# Patient Record
Sex: Male | Born: 1955 | Race: Black or African American | Hispanic: No | Smoking: Current every day smoker
Health system: Southern US, Community
[De-identification: ages and names within clinical notes are randomized; demographics above are authoritative.]

---

## 2021-02-12 ENCOUNTER — Emergency Department: Payer: Medicare PPO

## 2021-02-12 ENCOUNTER — Other Ambulatory Visit: Payer: Self-pay

## 2021-02-12 ENCOUNTER — Emergency Department
Admission: EM | Admit: 2021-02-12 | Discharge: 2021-02-13 | Disposition: A | Discharge status: Home / Self Care | Payer: Medicare PPO | Attending: Emergency Medicine | Admitting: Emergency Medicine

## 2021-02-12 DIAGNOSIS — R2243 Localized swelling, mass and lump, lower limb, bilateral: Secondary | ICD-10-CM | POA: Insufficient documentation

## 2021-02-12 DIAGNOSIS — R030 Elevated blood-pressure reading, without diagnosis of hypertension: Secondary | ICD-10-CM | POA: Diagnosis present

## 2021-02-12 DIAGNOSIS — F17210 Nicotine dependence, cigarettes, uncomplicated: Secondary | ICD-10-CM | POA: Insufficient documentation

## 2021-02-12 DIAGNOSIS — I10 Essential (primary) hypertension: Secondary | ICD-10-CM | POA: Insufficient documentation

## 2021-02-12 DIAGNOSIS — R202 Paresthesia of skin: Secondary | ICD-10-CM | POA: Diagnosis not present

## 2021-02-12 LAB — BASIC METABOLIC PANEL
Anion gap: 8 (ref 5–15)
BUN: 15 mg/dL (ref 8–23)
CO2: 23 mmol/L (ref 22–32)
Calcium: 9.5 mg/dL (ref 8.9–10.3)
Chloride: 107 mmol/L (ref 98–111)
Creatinine, Ser: 0.89 mg/dL (ref 0.61–1.24)
GFR, Estimated: >60 mL/min (ref >60)
Glucose, Bld: 104 mg/dL — ABNORMAL HIGH (ref 70–99)
Potassium: 4 mmol/L (ref 3.5–5.1)
Sodium: 138 mmol/L (ref 135–145)

## 2021-02-12 LAB — CBC
HCT: 47.3 % (ref 39.0–52.0)
Hemoglobin: 16.4 g/dL (ref 13.0–17.0)
MCH: 31 pg (ref 26.0–34.0)
MCHC: 34.7 g/dL (ref 30.0–36.0)
MCV: 89.4 fL (ref 80.0–100.0)
Platelets: 219 10*3/uL (ref 150–400)
RBC: 5.29 MIL/uL (ref 4.22–5.81)
RDW: 13.8 % (ref 11.5–15.5)
WBC: 5.3 10*3/uL (ref 4.0–10.5)
nRBC: 0 % (ref 0.0–0.2)

## 2021-02-12 LAB — TROPONIN I (HIGH SENSITIVITY): Troponin I (High Sensitivity): 4 ng/L (ref <18)

## 2021-02-12 MED ORDER — AMLODIPINE BESYLATE 5 MG PO TABS
5.0000 mg | ORAL_TABLET | Freq: Once | ORAL | Status: AC
  Administered 2021-02-12: 5 mg via ORAL
  Filled 2021-02-12: qty 1

## 2021-02-12 NOTE — ED Notes (Signed)
Pt presenting to ED with reports of HTN. Pt reports his daughter made him come after having a high BP reading at home. Pt denies hx of HTN. Denies headache. States he always has blurred vision but not worsening or any other vision changes. Denies CP. AAO NAD

## 2021-02-12 NOTE — ED Triage Notes (Signed)
Pt presents with a cc of HTN noted today at home. Pt reports a pressure of 190s/180s. Pt reports seeing "spots" for several days. Associated bilateral leg swelling and shortness of breath. Pt took 25mg  of metoprolol today around 4pm. No hx of HTN/CHF.

## 2021-02-12 NOTE — ED Notes (Signed)
Repeat trop not needed per Ward DO. Pt provided water and ginger ale

## 2021-02-13 LAB — URINALYSIS, COMPLETE (UACMP) WITH MICROSCOPIC
Bacteria, UA: NONE SEEN
Bilirubin Urine: NEGATIVE
Glucose, UA: NEGATIVE mg/dL
Hgb urine dipstick: NEGATIVE
Ketones, ur: NEGATIVE mg/dL
Leukocytes,Ua: NEGATIVE
Nitrite: NEGATIVE
Protein, ur: NEGATIVE mg/dL
Specific Gravity, Urine: 1.024 (ref 1.005–1.030)
Squamous Epithelial / HPF: NONE SEEN (ref 0–5)
pH: 5 (ref 5.0–8.0)

## 2021-02-13 MED ORDER — AMLODIPINE BESYLATE 10 MG PO TABS: 10.0000 mg | ORAL_TABLET | Freq: Every day | ORAL | 0 refills | Status: AC

## 2021-02-13 MED ORDER — AMLODIPINE BESYLATE 5 MG PO TABS
5.0000 mg | ORAL_TABLET | Freq: Once | ORAL | Status: AC
  Administered 2021-02-13: 5 mg via ORAL
  Filled 2021-02-13: qty 1

## 2021-02-13 NOTE — ED Notes (Signed)
Pt requesting to be discharged, states "I think I'd relax better at home. I think my blood pressure won't come down because I'm worried about keeping my daughter awake." Pt denies chest pain, states he will follow up but requests information for a primary care provider since he recently moved to the area.

## 2021-02-13 NOTE — ED Provider Notes (Signed)
[facility] Emergency Department Provider Note  ____________________________________________   Event Date/Time   First MD Initiated Contact with Patient 02/12/21 2257     (approximate)  I have reviewed the triage vital signs and the nursing notes.   HISTORY  Chief Complaint Hypertension    HPI Dylan SLEMMER Sr. is a 65 y.o. male with no significant past medical history who presents to the emergency department with complaints of high blood pressure.  States that he told his daughter that he had been seeing spots for the past 2 to 3 days and his daughter checked his blood pressure at home and it was elevated.  He states he cannot remember what the top number was but states the bottom number was 189.  He reports he has had intermittent headaches, shortness of breath, tingling in his feet, lower extremity swelling.  Denies having any symptoms currently.  No diplopia or loss of vision.  No chest pain.  No numbness or focal weakness.  States he thinks the last time his blood pressure was checked was 8 years ago.  He has never been diagnosed with hypertension before.        History reviewed. No pertinent past medical history.  There are no problems to display for this patient.   History reviewed. No pertinent surgical history.  Prior to Admission medications   Medication Sig Start Date End Date Taking? Authorizing Provider  amLODipine (NORVASC) 10 MG tablet Take 1 tablet (10 mg total) by mouth daily. 02/13/21 02/13/22 Yes Gabriana Wilmott, Layla Maw, DO    Allergies Patient has no known allergies.  No family history on file.  Social History Social History   Tobacco Use  . Smoking status: Current Every Day Smoker    Packs/day: 0.50    Types: Cigarettes  . Smokeless tobacco: Never Used  Vaping Use  . Vaping Use: Never used  Substance Use Topics  . Alcohol use: Yes    Comment: 5th of alcohol per week  . Drug use: Yes    Types: Marijuana    Comment: occ.     Review of Systems Constitutional: No fever. Eyes: No visual changes. ENT: No sore throat. Cardiovascular: Denies chest pain. Respiratory: Denies shortness of breath. Gastrointestinal: No nausea, vomiting, diarrhea. Genitourinary: Negative for dysuria. Musculoskeletal: Negative for back pain. Skin: Negative for rash. Neurological: Negative for focal weakness or numbness.  ____________________________________________   PHYSICAL EXAM:  VITAL SIGNS: ED Triage Vitals  Enc Vitals Group     BP 02/12/21 2027 (!) 171/109     Pulse Rate 02/12/21 2027 72     Resp 02/12/21 2027 18     Temp 02/12/21 2027 98.5 F (36.9 C)     Temp Source 02/12/21 2027 Oral     SpO2 02/12/21 2027 98 %     Weight 02/12/21 2037 235 lb (106.6 kg)     Height 02/12/21 2037 6' (1.829 m)     Head Circumference --      Peak Flow --      Pain Score 02/12/21 2037 0     Pain Loc --      Pain Edu? --      Excl. in GC? --    CONSTITUTIONAL: Alert and oriented and responds appropriately to questions. Well-appearing; well-nourished HEAD: Normocephalic EYES: Conjunctivae clear, pupils appear equal, EOM appear intact ENT: normal nose; moist mucous membranes NECK: Supple, normal ROM CARD: RRR; S1 and S2 appreciated; no murmurs, no clicks, no rubs, no gallops RESP:  Normal chest excursion without splinting or tachypnea; breath sounds clear and equal bilaterally; no wheezes, no rhonchi, no rales, no hypoxia or respiratory distress, speaking full sentences ABD/GI: Normal bowel sounds; non-distended; soft, non-tender, no rebound, no guarding, no peritoneal signs, no hepatosplenomegaly BACK: The back appears normal EXT: Normal ROM in all joints; no deformity noted, no edema; no cyanosis, no calf tenderness or calf swelling SKIN: Normal color for age and race; warm; no rash on exposed skin NEURO: Moves all extremities equally, strength 5/5 in all 4 extremities, cranial nerves II through XII intact, normal speech PSYCH:  The patient's mood and manner are appropriate.  ____________________________________________   LABS (all labs ordered are listed, but only abnormal results are displayed)  Labs Reviewed  BASIC METABOLIC PANEL - Abnormal; Notable for the following components:      Result Value   Glucose, Bld 104 (*)    All other components within normal limits  URINALYSIS, COMPLETE (UACMP) WITH MICROSCOPIC - Abnormal; Notable for the following components:   Color, Urine YELLOW (*)    APPearance CLEAR (*)    All other components within normal limits  CBC  TROPONIN I (HIGH SENSITIVITY)   ____________________________________________  EKG   EKG Interpretation  Date/Time:  Thursday Feb 12 2021 20:50:08 EDT Ventricular Rate:  73 PR Interval:  176 QRS Duration: 86 QT Interval:  386 QTC Calculation: 425 R Axis:   67 Text Interpretation: Normal sinus rhythm Possible Left atrial enlargement Borderline ECG Confirmed by Rochele Raring [phone]) on 02/12/2021 11:27:05 PM       ____________________________________________  RADIOLOGY Normajean Baxter Juniper Snyders, personally viewed and evaluated these images (plain radiographs) as part of my medical decision making, as well as reviewing the written report by the radiologist.  ED MD interpretation: Chest x-ray clear.  Official radiology report(s): DG Chest 2 View  Result Date: 02/12/2021 CLINICAL DATA:  Shortness of breath. Lower extremity swelling. Hypertension. EXAM: CHEST - 2 VIEW COMPARISON:  None. FINDINGS: The heart size and mediastinal contours are within normal limits. Both lungs are clear. Cervical spine fusion hardware noted. IMPRESSION: No active cardiopulmonary disease. Electronically Signed   By: Danae Orleans M.D.   On: 02/12/2021 21:23    ____________________________________________   PROCEDURES  Procedure(s) performed (including Critical Care):  Procedures   ____________________________________________   INITIAL IMPRESSION / ASSESSMENT AND  PLAN / ED COURSE  As part of my medical decision making, I reviewed the following data within the electronic MEDICAL RECORD NUMBER Nursing notes reviewed and incorporated, Labs reviewed , EKG interpreted , Old EKG reviewed, Old chart reviewed, Radiograph reviewed  and Notes from prior ED visits         Patient here with hypertension.  Intermittently symptomatic but currently has no symptoms.  Blood pressure currently 171/107.  Labs reassuring with normal creatinine, troponin.  Chest x-ray clear without widened mediastinum or cardiomegaly or pulmonary edema.  EKG shows no ischemia.  Will evaluate urinalysis for signs of endorgan damage and give amlodipine here to see if this improves his pressure.  I do not want to drop his pressure too quickly as I suspect that he has been hypertensive for some time given his last follow-up he believes was 8 years ago.  Will give outpatient PCP follow-up information.  ED PROGRESS  Urine shows no proteinuria or hematuria.  He continues to be asymptomatic.  He is still hypertensive despite 10 mg of amlodipine but is requesting discharge home at this time.  Will give outpatient PCP follow-up and discharge  with prescription of amlodipine.  Discussed return precautions.  He is comfortable with this plan.  At this time, I do not feel there is any life-threatening condition present. I have reviewed, interpreted and discussed all results (EKG, imaging, lab, urine as appropriate) and exam findings with patient/family. I have reviewed nursing notes and appropriate previous records.  I feel the patient is safe to be discharged home without further emergent workup and can continue workup as an outpatient as needed. Discussed usual and customary return precautions. Patient/family verbalize understanding and are comfortable with this plan.  Outpatient follow-up has been provided as needed. All questions have been answered.  ____________________________________________   FINAL  CLINICAL IMPRESSION(S) / ED DIAGNOSES  Final diagnoses:  Hypertension, unspecified type     ED Discharge Orders         Ordered    amLODipine (NORVASC) 10 MG tablet  Daily        02/13/21 0114          *Please note:  Dylan Fam Sr. was evaluated in Emergency Department on 02/13/2021 for the symptoms described in the history of present illness. He was evaluated in the context of the global COVID-19 pandemic, which necessitated consideration that the patient might be at risk for infection with the SARS-CoV-2 virus that causes COVID-19. Institutional protocols and algorithms that pertain to the evaluation of patients at risk for COVID-19 are in a state of rapid change based on information released by regulatory bodies including the CDC and federal and state organizations. These policies and algorithms were followed during the patient's care in the ED.  Some ED evaluations and interventions may be delayed as a result of limited staffing during and the pandemic.*   Note:  This document was prepared using Dragon voice recognition software and may include unintentional dictation errors.   Jaquasha Carnevale, Layla Maw, DO 02/13/21 [phone]

## 2021-02-13 NOTE — Discharge Instructions (Addendum)
Steps to find a Primary Care Provider (PCP):  Call [phone] or [phone] to access "[facility] Find a Doctor Service."  2.  You may also go on the [facility] website at [email or web address]  

## 2022-03-07 IMAGING — CR DG CHEST 2V
1 series · 2 of 2 positions shown · non-contrast
Comparison: None.

CLINICAL DATA: Shortness of breath. Lower extremity swelling.
Hypertension.

EXAM:
CHEST - 2 VIEW

[Series 1: dg chest 2 view · 0.14mm/px · 2 of 2 slices shown]
[im 1/2]
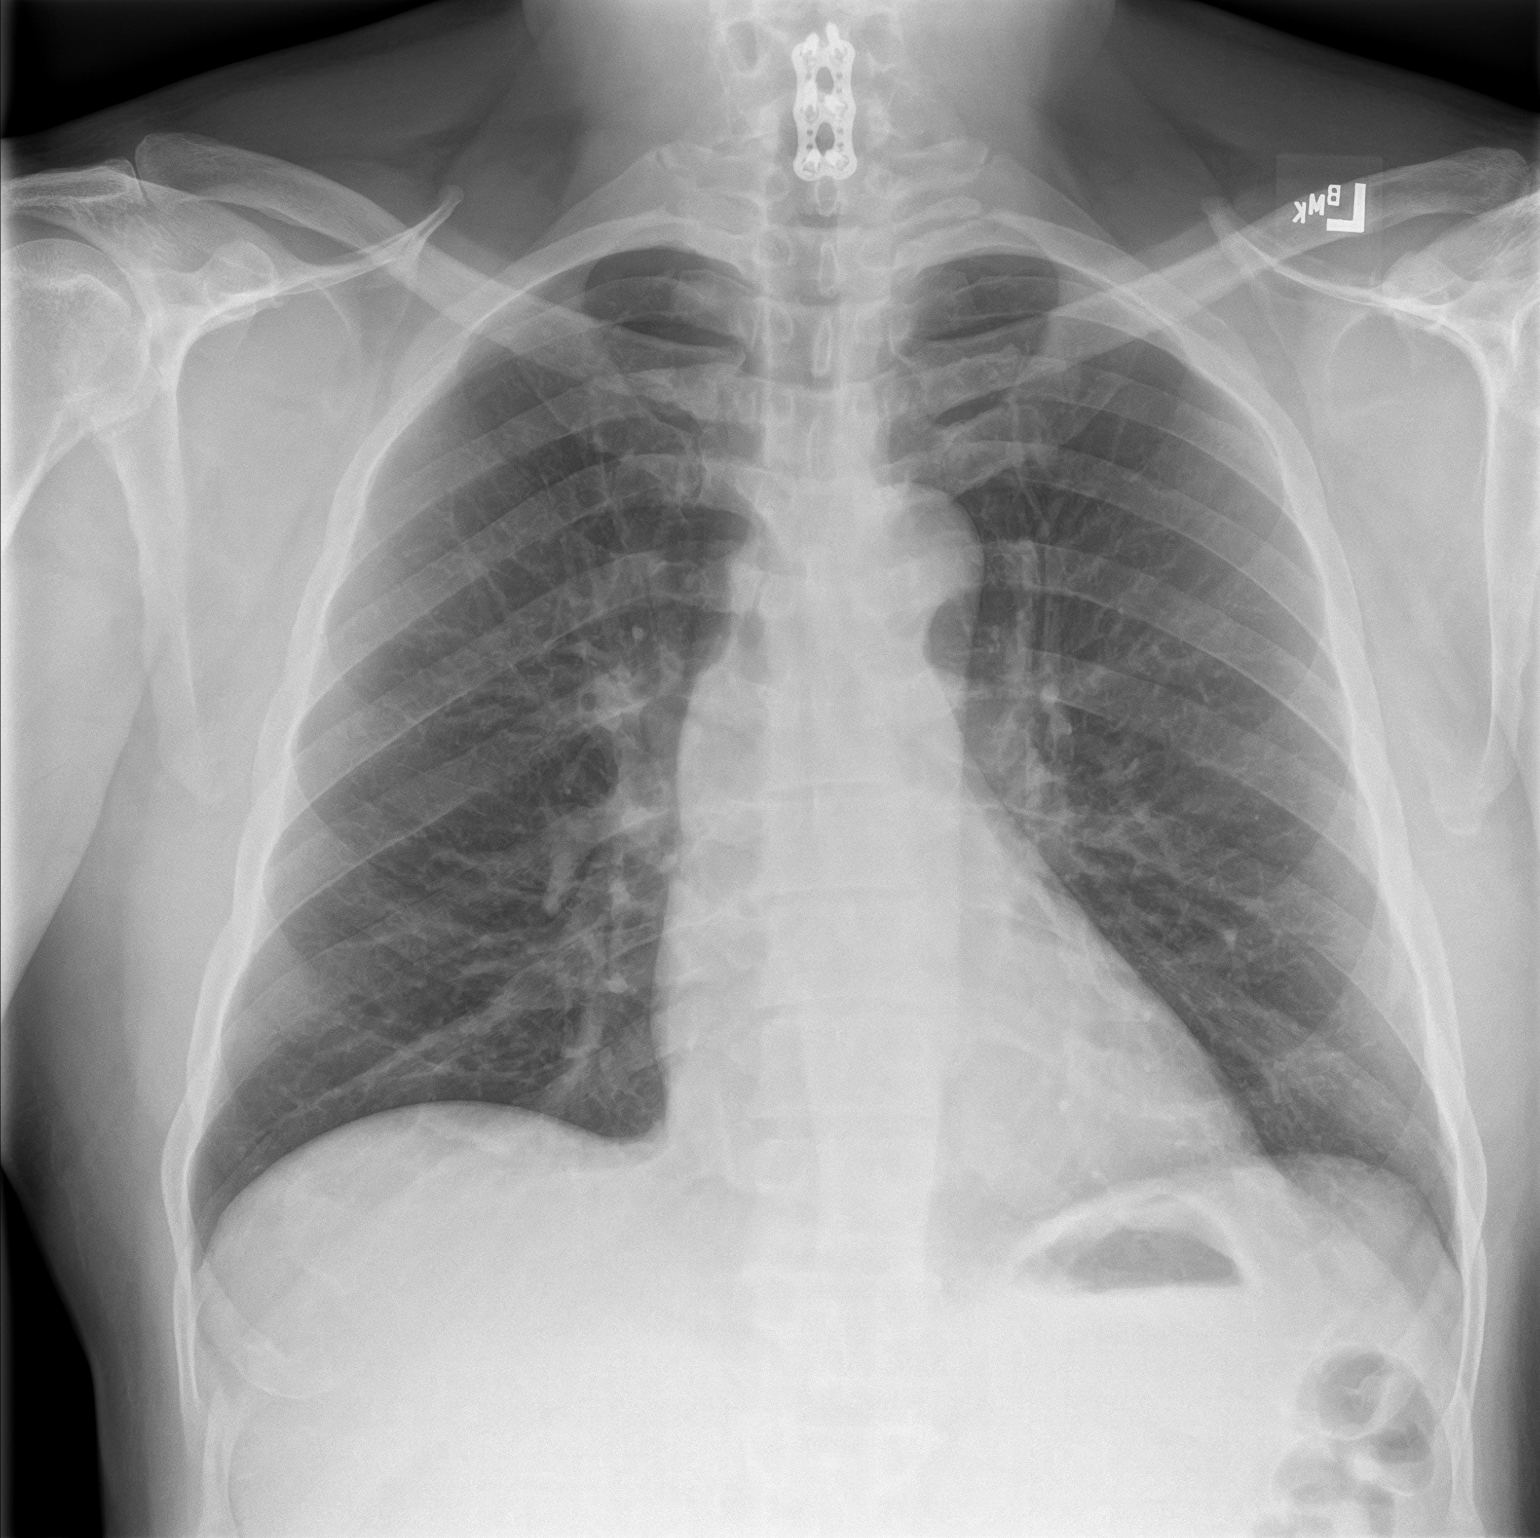
[im 2/2]
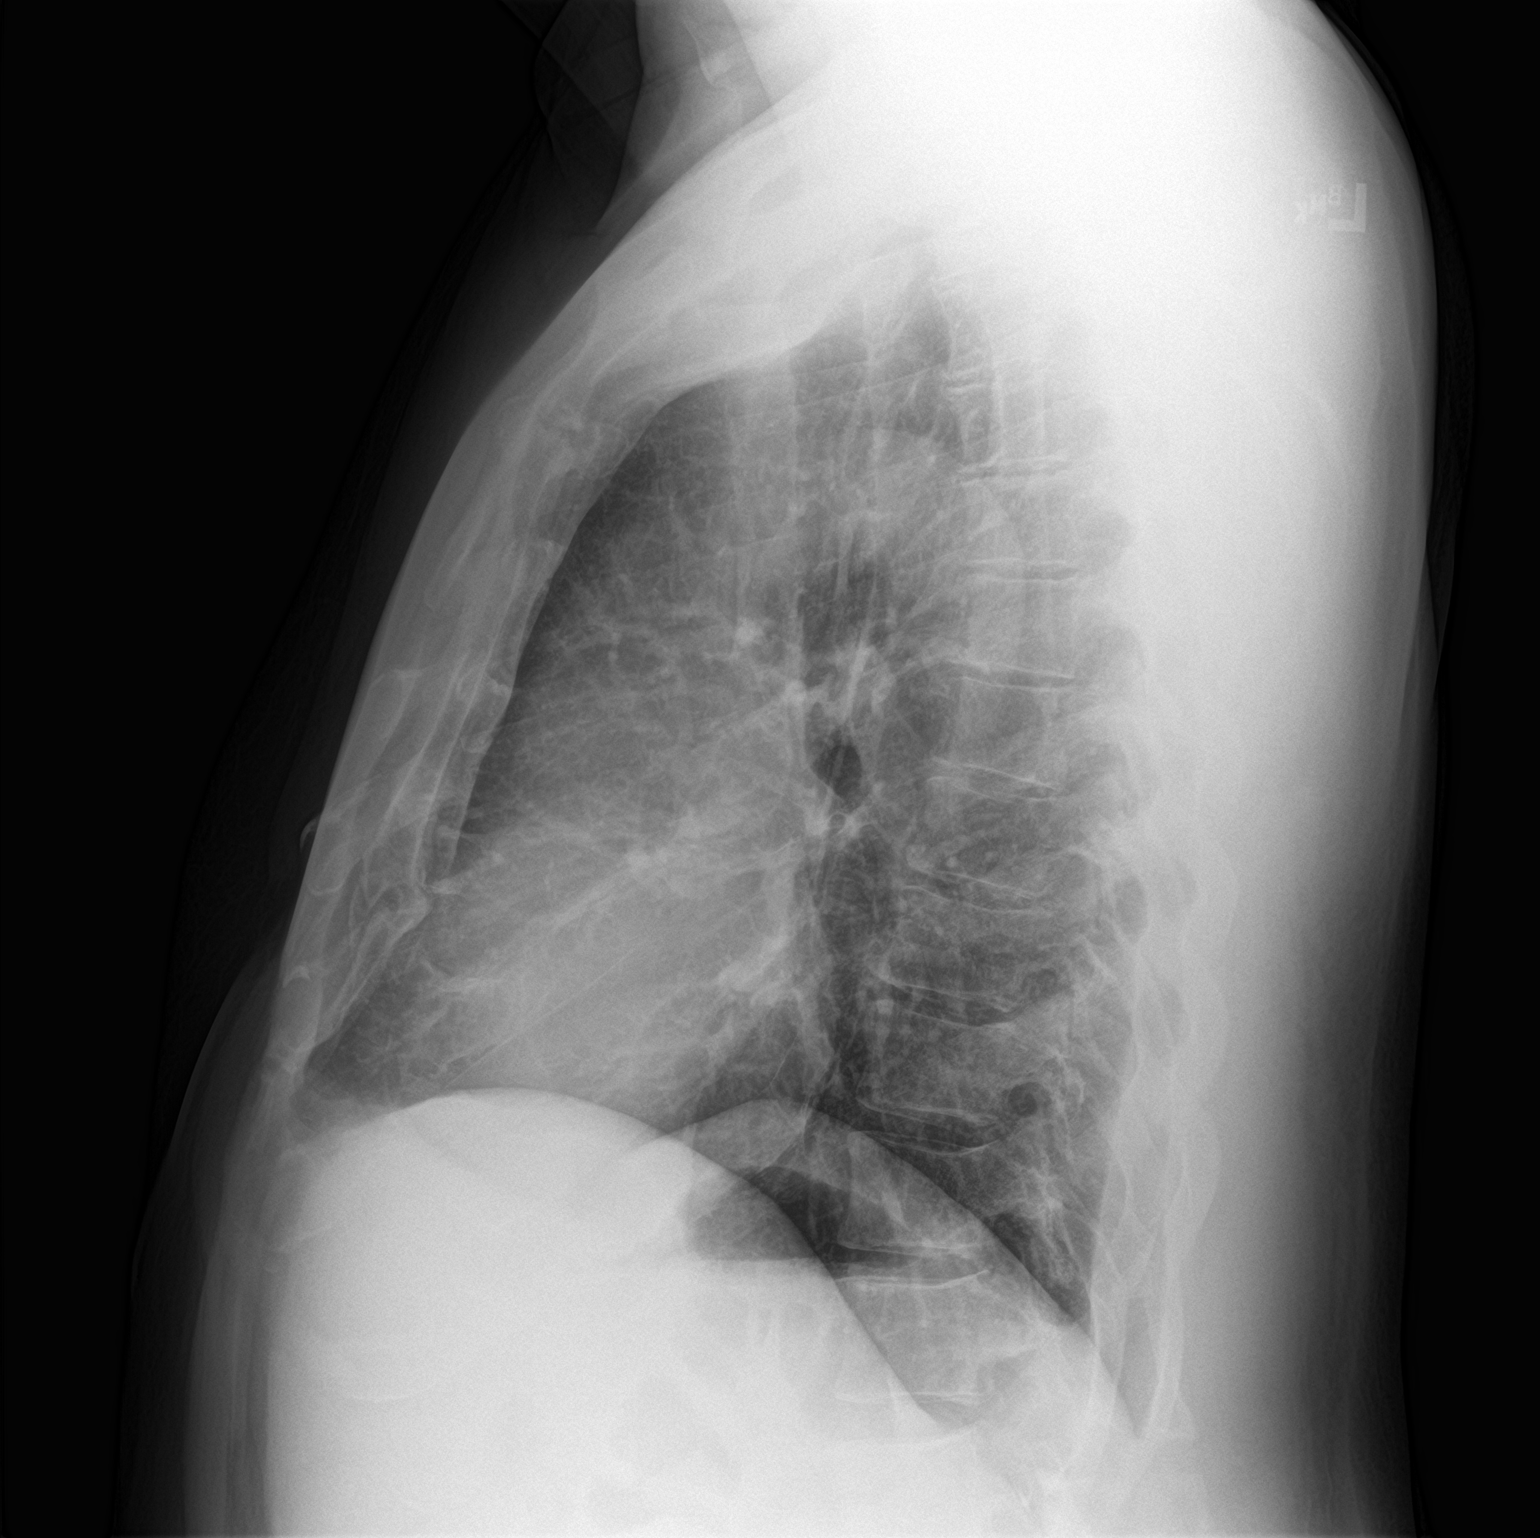

[2 of 2 positions shown; findings below may reference images not displayed]

FINDINGS: The heart size and mediastinal contours are within normal limits.
Both lungs are clear. Cervical spine fusion hardware noted.
IMPRESSION: No active cardiopulmonary disease.

## 2022-07-25 ENCOUNTER — Emergency Department
Admission: EM | Admit: 2022-07-25 | Discharge: 2022-07-25 | Disposition: A | Discharge status: Home / Self Care | Payer: Medicare Other | Attending: Student in an Organized Health Care Education/Training Program | Admitting: Student in an Organized Health Care Education/Training Program

## 2022-07-25 ENCOUNTER — Emergency Department: Payer: Medicare Other

## 2022-07-25 ENCOUNTER — Other Ambulatory Visit: Payer: Self-pay

## 2022-07-25 ENCOUNTER — Encounter: Payer: Self-pay | Admitting: Intensive Care

## 2022-07-25 DIAGNOSIS — B029 Zoster without complications: Secondary | ICD-10-CM | POA: Insufficient documentation

## 2022-07-25 DIAGNOSIS — H05012 Cellulitis of left orbit: Secondary | ICD-10-CM | POA: Insufficient documentation

## 2022-07-25 DIAGNOSIS — L03213 Periorbital cellulitis: Secondary | ICD-10-CM

## 2022-07-25 DIAGNOSIS — R519 Headache, unspecified: Secondary | ICD-10-CM | POA: Diagnosis present

## 2022-07-25 LAB — CBC WITH DIFFERENTIAL/PLATELET
Abs Immature Granulocytes: 0.01 10*3/uL (ref 0.00–0.07)
Basophils Absolute: 0 10*3/uL (ref 0.0–0.1)
Basophils Relative: 0 %
Eosinophils Absolute: 0.1 10*3/uL (ref 0.0–0.5)
Eosinophils Relative: 2 %
HCT: 49.1 % (ref 39.0–52.0)
Hemoglobin: 16.2 g/dL (ref 13.0–17.0)
Immature Granulocytes: 0 %
Lymphocytes Relative: 30 %
Lymphs Abs: 1.3 10*3/uL (ref 0.7–4.0)
MCH: 29.2 pg (ref 26.0–34.0)
MCHC: 33 g/dL (ref 30.0–36.0)
MCV: 88.5 fL (ref 80.0–100.0)
Monocytes Absolute: 0.2 10*3/uL (ref 0.1–1.0)
Monocytes Relative: 4 %
Neutro Abs: 2.9 10*3/uL (ref 1.7–7.7)
Neutrophils Relative %: 64 %
Platelets: 203 10*3/uL (ref 150–400)
RBC: 5.55 MIL/uL (ref 4.22–5.81)
RDW: 14.1 % (ref 11.5–15.5)
WBC: 4.5 10*3/uL (ref 4.0–10.5)
nRBC: 0 % (ref 0.0–0.2)

## 2022-07-25 LAB — COMPREHENSIVE METABOLIC PANEL
ALT: 19 U/L (ref 0–44)
AST: 25 U/L (ref 15–41)
Albumin: 4.2 g/dL (ref 3.5–5.0)
Alkaline Phosphatase: 78 U/L (ref 38–126)
Anion gap: 6 (ref 5–15)
BUN: 13 mg/dL (ref 8–23)
CO2: 23 mmol/L (ref 22–32)
Calcium: 9.4 mg/dL (ref 8.9–10.3)
Chloride: 107 mmol/L (ref 98–111)
Creatinine, Ser: 1.07 mg/dL (ref 0.61–1.24)
GFR, Estimated: >60 mL/min (ref >60)
Glucose, Bld: 146 mg/dL — ABNORMAL HIGH (ref 70–99)
Potassium: 3.9 mmol/L (ref 3.5–5.1)
Sodium: 136 mmol/L (ref 135–145)
Total Bilirubin: 1.1 mg/dL (ref 0.3–1.2)
Total Protein: 7.9 g/dL (ref 6.5–8.1)

## 2022-07-25 MED ORDER — CEPHALEXIN 500 MG PO CAPS
500.0000 mg | ORAL_CAPSULE | Freq: Three times a day (TID) | ORAL | 0 refills | Status: AC
  Filled 2022-07-25: qty 21, 7d supply, fill #0

## 2022-07-25 MED ORDER — VALACYCLOVIR HCL 500 MG PO TABS
1000.0000 mg | ORAL_TABLET | Freq: Once | ORAL | Status: AC
  Administered 2022-07-25: 1000 mg via ORAL
  Filled 2022-07-25: qty 2

## 2022-07-25 MED ORDER — TETRACAINE HCL 0.5 % OP SOLN
2.0000 [drp] | Freq: Once | OPHTHALMIC | Status: DC
  Filled 2022-07-25: qty 4

## 2022-07-25 MED ORDER — PREDNISONE 20 MG PO TABS
40.0000 mg | ORAL_TABLET | Freq: Every day | ORAL | 0 refills | Status: AC
  Filled 2022-07-25: qty 10, 5d supply, fill #0

## 2022-07-25 MED ORDER — IOHEXOL 300 MG/ML  SOLN
75.0000 mL | Freq: Once | INTRAMUSCULAR | Status: AC | PRN
  Administered 2022-07-25: 75 mL via INTRAVENOUS

## 2022-07-25 MED ORDER — HYDROCODONE-ACETAMINOPHEN 5-325 MG PO TABS
1.0000 | ORAL_TABLET | ORAL | 0 refills | Status: AC | PRN
  Filled 2022-07-25: qty 8, 2d supply, fill #0

## 2022-07-25 MED ORDER — VALACYCLOVIR HCL 1 G PO TABS
1000.0000 mg | ORAL_TABLET | Freq: Three times a day (TID) | ORAL | 0 refills | Status: AC
  Filled 2022-07-25: qty 21, 7d supply, fill #0

## 2022-07-25 MED ORDER — FLUORESCEIN SODIUM 1 MG OP STRP
2.0000 | ORAL_STRIP | Freq: Once | OPHTHALMIC | Status: AC
  Administered 2022-07-25: 2 via OPHTHALMIC
  Filled 2022-07-25: qty 2

## 2022-07-25 MED ORDER — CEPHALEXIN 500 MG PO CAPS
500.0000 mg | ORAL_CAPSULE | Freq: Once | ORAL | Status: AC
  Administered 2022-07-25: 500 mg via ORAL
  Filled 2022-07-25: qty 1

## 2022-07-25 NOTE — ED Triage Notes (Signed)
Patient presents with left eye swelling since Thursday morning. Also reports soreness and intermittent headache on left side of head/face. Reports swelling and pain has gradually gotten worse.

## 2022-07-25 NOTE — ED Provider Triage Note (Signed)
  Emergency Medicine Provider Triage Evaluation Note  Dylan SAGAR Sr. , a 66 y.o.male,  was evaluated in triage.  Pt complains of left eye swelling since Thursday morning.  Denies any recent injuries or illnesses.  He does state that he feels pain radiating to the left side of his head/face.  He states that the swelling is gradually gotten worse.   Review of Systems  Positive: Left eye swelling. Negative: Denies fever, chest pain, vomiting  Physical Exam   Vitals:   07/25/22 1653  BP: (!) 162/92  Pulse: 80  Resp: 16  Temp: 98.2 F (36.8 C)  SpO2: 100%   Gen:   Awake, no distress   Resp:  Normal effort  MSK:   Moves extremities without difficulty  Other:  Significant edema along the left periorbital space.  Medical Decision Making  Given the patient's initial medical screening exam, the following diagnostic evaluation has been ordered. The patient will be placed in the appropriate treatment space, once one is available, to complete the evaluation and treatment. I have discussed the plan of care with the patient and I have advised the patient that an ED physician or mid-level practitioner will reevaluate their condition after the test results have been received, as the results may give them additional insight into the type of treatment they may need.    Diagnostics: Labs, CT orbits.  Treatments: none immediately   Dylan, [place], [place] 07/25/22 1717

## 2022-07-25 NOTE — ED Provider Notes (Signed)
[facility] Provider Note    Event Date/Time   First MD Initiated Contact with Patient 07/25/22 2230     (approximate)   History   Facial Swelling (left)   HPI  Dylan EMIG Sr. is a 66 y.o. male no significant past medical history presents to the ER for evaluation of left forehead pain as well as headache.  The symptoms started several days ago.  Started noticing a blister to surround his left eyebrow and then today woke up with swelling of the left eye and eyelid.  No measured fevers.  No neck stiffness.  No numbness or tingling.  No blurry vision.     Physical Exam   Triage Vital Signs: ED Triage Vitals  Enc Vitals Group     BP 07/25/22 1653 (!) 162/92     Pulse Rate 07/25/22 1653 80     Resp 07/25/22 1653 16     Temp 07/25/22 1653 98.2 F (36.8 C)     Temp Source 07/25/22 1653 Oral     SpO2 07/25/22 1653 100 %     Weight 07/25/22 1656 200 lb (90.7 kg)     Height 07/25/22 1656 6' (1.829 m)     Head Circumference --      Peak Flow --      Pain Score 07/25/22 1655 7     Pain Loc --      Pain Edu? --      Excl. in GC? --     Most recent vital signs: Vitals:   07/25/22 1653  BP: (!) 162/92  Pulse: 80  Resp: 16  Temp: 98.2 F (36.8 C)  SpO2: 100%     Constitutional: Alert  Eyes: Conjunctivae are normal.  Head: Atraumatic.  Vesicular eruption in the V1 left dermatome.  Left periorbital swelling is mild with some erythema of the left upper eyelid.  No proptosis.  Extraocular motions are intact.  No fluorescein uptake on Woods lamp exam. Nose: No congestion/rhinnorhea. Mouth/Throat: Mucous membranes are moist.   Neck: Painless ROM.  Cardiovascular:   Good peripheral circulation. Respiratory: Normal respiratory effort.  No retractions.  Gastrointestinal: Soft and nontender.  Musculoskeletal:  no deformity Neurologic:  MAE spontaneously. No gross focal neurologic deficits are appreciated.  Skin:  Skin is warm, dry and intact. No rash  noted. Psychiatric: Mood and affect are normal. Speech and behavior are normal.    ED Results / Procedures / Treatments   Labs (all labs ordered are listed, but only abnormal results are displayed) Labs Reviewed  COMPREHENSIVE METABOLIC PANEL - Abnormal; Notable for the following components:      Result Value   Glucose, Bld 146 (*)    All other components within normal limits  CBC WITH DIFFERENTIAL/PLATELET     EKG     RADIOLOGY Please see ED Course for my review and interpretation.  I personally reviewed all radiographic images ordered to evaluate for the above acute complaints and reviewed radiology reports and findings.  These findings were personally discussed with the patient.  Please see medical record for radiology report.    PROCEDURES:  Critical Care performed: No  Procedures   MEDICATIONS ORDERED IN ED: Medications  valACYclovir (VALTREX) tablet 1,000 mg (has no administration in time range)  cephALEXin (KEFLEX) capsule 500 mg (has no administration in time range)  iohexol (OMNIPAQUE) 300 MG/ML solution 75 mL (75 mLs Intravenous Contrast Given 07/25/22 2019)  fluorescein ophthalmic strip 2 strip (2 strips Both Eyes Given  by Other 07/25/22 2240)     IMPRESSION / MDM / ASSESSMENT AND PLAN / ED COURSE  I reviewed the triage vital signs and the nursing notes.                              Differential diagnosis includes, but is not limited to, periorbital cellulitis, orbital cellulitis, shingles, angioedema, urticaria, abscess  Patient presenting to the ER for evaluation of symptoms as described above.  Based on symptoms, risk factors and considered above differential, this presenting complaint could reflect a potentially life-threatening illness therefore the patient will be placed on continuous pulse oximetry and telemetry for monitoring.  Laboratory evaluation will be sent to evaluate for the above complaints.  Patient nontoxic-appearing and his exam is  consistent with shingles of V1 distribution.  CT imaging ordered at triage does not show any evidence of abscess.  Is not consistent with orbital cellulitis.  His ocular exam is reassuring.  No fluorescein uptake.  Will be treated for shingles as well as given course of antibiotics for some amount of overlying cellulitic change of the eyelid.  We discussed strict return precautions.  Patient agreeable to plan.      FINAL CLINICAL IMPRESSION(S) / ED DIAGNOSES   Final diagnoses:  Preseptal cellulitis of left upper eyelid  Herpes zoster without complication     Rx / DC Orders   ED Discharge Orders          Ordered    cephALEXin (KEFLEX) 500 MG capsule  3 times daily        07/25/22 2251    valACYclovir (VALTREX) 1000 MG tablet  3 times daily        07/25/22 2251    predniSONE (DELTASONE) 20 MG tablet  Daily        07/25/22 2251    HYDROcodone-acetaminophen (NORCO/VICODIN) 5-325 MG tablet  Every 4 hours PRN        07/25/22 2251             Note:  This document was prepared using Dragon voice recognition software and may include unintentional dictation errors.    Merlyn Lot, MD 07/25/22 [phone]

## 2022-07-25 NOTE — ED Notes (Signed)
Patient awaiting voices no complaints at this time.

## 2022-07-26 ENCOUNTER — Other Ambulatory Visit: Payer: Self-pay

## 2022-07-27 ENCOUNTER — Other Ambulatory Visit: Payer: Self-pay
